# Patient Record
Sex: Male | Born: 1960 | Race: Black or African American | Hispanic: No | Marital: Single | Smoking: Never smoker
Health system: Southern US, Community
[De-identification: ages and names within clinical notes are randomized; demographics above are authoritative.]

## PROBLEM LIST (undated history)

## (undated) DIAGNOSIS — I10 Essential (primary) hypertension: Secondary | ICD-10-CM

---

## 2014-01-18 ENCOUNTER — Emergency Department: Payer: Self-pay | Admitting: Emergency Medicine

## 2014-01-18 LAB — CBC
HCT: 45.6 % (ref 40.0–52.0)
HGB: 15.5 g/dL (ref 13.0–18.0)
MCH: 32 pg (ref 26.0–34.0)
MCHC: 34 g/dL (ref 32.0–36.0)
MCV: 94 fL (ref 80–100)
PLATELETS: 325 10*3/uL (ref 150–440)
RBC: 4.84 10*6/uL (ref 4.40–5.90)
RDW: 13.5 % (ref 11.5–14.5)
WBC: 10.2 10*3/uL (ref 3.8–10.6)

## 2014-01-18 LAB — COMPREHENSIVE METABOLIC PANEL
AST: 36 U/L (ref 15–37)
Albumin: 4.4 g/dL (ref 3.4–5.0)
Alkaline Phosphatase: 85 U/L
Anion Gap: 6 — ABNORMAL LOW (ref 7–16)
BUN: 22 mg/dL — ABNORMAL HIGH (ref 7–18)
Bilirubin,Total: 0.5 mg/dL (ref 0.2–1.0)
CALCIUM: 9.5 mg/dL (ref 8.5–10.1)
CHLORIDE: 106 mmol/L (ref 98–107)
Co2: 26 mmol/L (ref 21–32)
Creatinine: 1.39 mg/dL — ABNORMAL HIGH (ref 0.60–1.30)
EGFR (African American): >60
EGFR (Non-African Amer.): 57 — ABNORMAL LOW
Glucose: 75 mg/dL (ref 65–99)
OSMOLALITY: 278 (ref 275–301)
Potassium: 3.8 mmol/L (ref 3.5–5.1)
SGPT (ALT): 25 U/L
SODIUM: 138 mmol/L (ref 136–145)
Total Protein: 8.7 g/dL — ABNORMAL HIGH (ref 6.4–8.2)

## 2014-02-26 LAB — CBC
HCT: 45.6 % (ref 40.0–52.0)
HGB: 15 g/dL (ref 13.0–18.0)
MCH: 30.8 pg (ref 26.0–34.0)
MCHC: 32.9 g/dL (ref 32.0–36.0)
MCV: 94 fL (ref 80–100)
PLATELETS: 359 10*3/uL (ref 150–440)
RBC: 4.87 10*6/uL (ref 4.40–5.90)
RDW: 13.6 % (ref 11.5–14.5)
WBC: 11.2 10*3/uL — ABNORMAL HIGH (ref 3.8–10.6)

## 2014-02-26 LAB — COMPREHENSIVE METABOLIC PANEL
ALK PHOS: 76 U/L (ref 46–116)
ALT: 28 U/L (ref 14–63)
AST: 53 U/L — AB (ref 15–37)
Albumin: 4.4 g/dL (ref 3.4–5.0)
Anion Gap: 7 (ref 7–16)
BUN: 19 mg/dL — ABNORMAL HIGH (ref 7–18)
Bilirubin,Total: 0.4 mg/dL (ref 0.2–1.0)
CO2: 27 mmol/L (ref 21–32)
Calcium, Total: 9.4 mg/dL (ref 8.5–10.1)
Chloride: 102 mmol/L (ref 98–107)
Creatinine: 1.52 mg/dL — ABNORMAL HIGH (ref 0.60–1.30)
EGFR (African American): >60
EGFR (Non-African Amer.): 51 — ABNORMAL LOW
Glucose: 73 mg/dL (ref 65–99)
OSMOLALITY: 273 (ref 275–301)
POTASSIUM: 3.8 mmol/L (ref 3.5–5.1)
Sodium: 136 mmol/L (ref 136–145)
Total Protein: 8.5 g/dL — ABNORMAL HIGH (ref 6.4–8.2)

## 2014-02-26 LAB — URINALYSIS, COMPLETE
BACTERIA: NONE SEEN
BILIRUBIN, UR: NEGATIVE
GLUCOSE, UR: NEGATIVE mg/dL (ref 0–75)
Ketone: NEGATIVE
Leukocyte Esterase: NEGATIVE
Nitrite: NEGATIVE
Ph: 5 (ref 4.5–8.0)
Protein: NEGATIVE
RBC,UR: NONE SEEN /HPF (ref 0–5)
SPECIFIC GRAVITY: 1.015 (ref 1.003–1.030)
Squamous Epithelial: NONE SEEN

## 2014-02-26 LAB — ACETAMINOPHEN LEVEL

## 2014-02-26 LAB — DRUG SCREEN, URINE
Amphetamines, Ur Screen: NEGATIVE (ref ?–1000)
Barbiturates, Ur Screen: NEGATIVE (ref ?–200)
Benzodiazepine, Ur Scrn: NEGATIVE (ref ?–200)
CANNABINOID 50 NG, UR ~~LOC~~: NEGATIVE (ref ?–50)
Cocaine Metabolite,Ur ~~LOC~~: POSITIVE (ref ?–300)
MDMA (Ecstasy)Ur Screen: NEGATIVE (ref ?–500)
Methadone, Ur Screen: NEGATIVE (ref ?–300)
OPIATE, UR SCREEN: NEGATIVE (ref ?–300)
PHENCYCLIDINE (PCP) UR S: NEGATIVE (ref ?–25)
Tricyclic, Ur Screen: NEGATIVE (ref ?–1000)

## 2014-02-26 LAB — ETHANOL: Ethanol: 106 mg/dL

## 2014-02-26 LAB — SALICYLATE LEVEL: SALICYLATES, SERUM: 4.2 mg/dL — AB

## 2014-02-27 ENCOUNTER — Inpatient Hospital Stay: Payer: Self-pay | Admitting: Psychiatry

## 2014-03-01 LAB — BASIC METABOLIC PANEL
Anion Gap: 7 (ref 7–16)
BUN: 15 mg/dL (ref 7–18)
CO2: 29 mmol/L (ref 21–32)
CREATININE: 1.35 mg/dL — AB (ref 0.60–1.30)
Calcium, Total: 9.7 mg/dL (ref 8.5–10.1)
Chloride: 103 mmol/L (ref 98–107)
GFR CALC NON AF AMER: 59 — AB
GLUCOSE: 102 mg/dL — AB (ref 65–99)
Osmolality: 279 (ref 275–301)
Potassium: 4.1 mmol/L (ref 3.5–5.1)
SODIUM: 139 mmol/L (ref 136–145)

## 2014-03-03 LAB — BASIC METABOLIC PANEL
Anion Gap: 8 (ref 7–16)
BUN: 17 mg/dL (ref 7–18)
CALCIUM: 9.4 mg/dL (ref 8.5–10.1)
CHLORIDE: 100 mmol/L (ref 98–107)
Co2: 29 mmol/L (ref 21–32)
Creatinine: 1.44 mg/dL — ABNORMAL HIGH (ref 0.60–1.30)
EGFR (Non-African Amer.): 55 — ABNORMAL LOW
Glucose: 92 mg/dL (ref 65–99)
Osmolality: 275 (ref 275–301)
Potassium: 4 mmol/L (ref 3.5–5.1)
Sodium: 137 mmol/L (ref 136–145)

## 2014-05-30 NOTE — Consult Note (Signed)
PATIENT NAME:  Bryan Benton, Bryan Benton MR#:  [id number] DATE OF BIRTH:  May 10, 1960  DATE OF CONSULTATION:  02/26/2014  IDENTIFYING INFORMATION AND REASON FOR CONSULTATION: A 54 year old man with a history of cocaine dependence and depression.   CHIEF COMPLAINT: "I relapsed on crack."   HISTORY OF PRESENT ILLNESS: The patient comes into the hospital saying that he relapsed on crack cocaine last night, but also reporting that he is having a return of severe depression hallucinations. He has been feeling very depressed for the last couple of weeks and has been having auditory hallucinations. Voices telling him to do bad things and hurt himself. This has been getting worse for a couple of weeks. He had run out of his psychiatric medicine a few weeks ago. He relapsed onto crack cocaine last night after about 7 months of sobriety. Was not drinking or using any other drugs. Reports having serious thoughts about wanting to die or wanting to kill himself. Currently no homicidal ideation.   PAST PSYCHIATRIC HISTORY: He has been treated with Prozac and Zyprexa in the past. He also, at one time in prison, was on Haldol. He denies that he has ever tried to kill himself in the past, does not have a serious history of violence. He says that the Prozac and Zyprexa were helpful for him.   SUBSTANCE ABUSE HISTORY: Cocaine abuse in the form of crack cocaine for years. He has been able to stay clean for up to a year at a time. He used to go to [facility], where he was not only getting medicine, but going to groups, but stopped going a while ago.   SOCIAL HISTORY: Lives with a fiance. Works at a [employer]. Likes his job and values it. Has an adult child, who lives elsewhere.   FAMILY HISTORY: No known family history of mental health problems.   PAST MEDICAL HISTORY: Has high blood pressure, supposed to be on hydrochlorothiazide and lisinopril.   REVIEW OF SYSTEMS: Sad mood. Auditory hallucinations. Suicidal ideation.  Fatigue. No other specific physical complaints.   MENTAL STATUS EXAMINATION: The patient is awake, alert and interactive. Eye contact is good. Psychomotor activity slow. Speech very quiet, almost whispered, decreased in amount. Affect flat. Mood stated as depressed. Thoughts are slow, but lucid. No obvious delusions. Endorses auditory hallucinations. Denies visual hallucinations and endorses suicidal ideation. No homicidal ideation. Able to repeat 3 words immediately, remembers only 1 out of 3 at 3 minutes. Judgment and insight impaired. Alert and oriented x4. Intelligence normal.   LABORATORY RESULTS: Drug screen positive for cocaine. Chemistry panel: Elevated creatinine 1.52, BUN elevated and 19 elevated AST at 53. Alcohol level 106. CBC: Elevated white count of 11.2, otherwise unremarkable. Urinalysis unremarkable.   VITAL SIGNS: Blood pressure 148/96, respirations 18, pulse 74, temperature 97.8.   ASSESSMENT: A 54 year old man with a history of depression with psychotic features and cocaine abuse. Started having psychosis and depression for the last couple of weeks, only relapsed on cocaine yesterday. Having suicidal ideation. Needs hospitalization.   TREATMENT PLAN: Admit to psychiatry. Suicide, fall, and close precautions in place. Restart Prozac 20 mg per day and Zyprexa 10 mg at night.   DIAGNOSIS, PRINCIPAL AND PRIMARY:  AXIS I: Major depression, recurrent, severe with psychotic features.   SECONDARY DIAGNOSES:  AXIS I: Cocaine abuse, severe.   Alcohol abuse, mild to moderate.   AXIS II: Deferred.   AXIS III: High blood pressure.   ____________________________ Gonzella Lex, MD jtc:ap D: 02/26/2014 16:38:00 ET  T: 02/26/2014 16:52:52 ET JOB#: [id number]  cc: Gonzella Lex, MD, <Dictator> Gonzella Lex MD ELECTRONICALLY SIGNED 03/10/2014 17:25
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
## 2018-02-27 ENCOUNTER — Telehealth: Payer: Self-pay | Admitting: Gastroenterology

## 2018-02-27 NOTE — Telephone Encounter (Signed)
Returned patients call to schedule his colonoscopy. LVM for him to call back, and if I am unable to take his call when he calls back I will do my best to call him by the end of the day or the next business morning.  Thanks [employer]

## 2018-02-27 NOTE — Telephone Encounter (Signed)
Patient called returning Michele's call to schedule a colonoscopy

## 2018-03-10 ENCOUNTER — Encounter: Payer: Self-pay | Admitting: *Deleted

## 2018-03-17 ENCOUNTER — Telehealth: Payer: Self-pay | Admitting: Gastroenterology

## 2018-03-17 ENCOUNTER — Other Ambulatory Visit: Payer: Self-pay

## 2018-03-17 DIAGNOSIS — Z1211 Encounter for screening for malignant neoplasm of colon: Secondary | ICD-10-CM

## 2018-03-17 NOTE — Telephone Encounter (Signed)
Call returned.  Colonoscopy has been scheduled with Dr. Marius Ditch for 04/07/18 at [facility].  Thanks [employer]

## 2018-03-17 NOTE — Telephone Encounter (Signed)
Patient called returning Michelle's call & letter he received.

## 2018-04-02 ENCOUNTER — Other Ambulatory Visit: Payer: Self-pay

## 2018-04-02 MED ORDER — PEG 3350-KCL-NA BICARB-NACL 420 G PO SOLR: 4000.0000 mL | Freq: Once | ORAL | 0 refills | Status: AC

## 2018-04-04 ENCOUNTER — Other Ambulatory Visit: Payer: Self-pay

## 2018-04-04 MED ORDER — PEG 3350-KCL-NA BICARB-NACL 420 G PO SOLR: 4000.0000 mL | Freq: Once | ORAL | 0 refills | Status: AC

## 2018-04-07 ENCOUNTER — Encounter: Payer: Self-pay | Admitting: Student

## 2018-04-07 ENCOUNTER — Encounter
Admission: RE | Disposition: A | Discharge status: Home / Self Care | Payer: Self-pay | Source: Home / Self Care | Attending: Gastroenterology

## 2018-04-07 ENCOUNTER — Other Ambulatory Visit: Payer: Self-pay

## 2018-04-07 ENCOUNTER — Ambulatory Visit: Payer: Commercial Managed Care - PPO | Admitting: Certified Registered"

## 2018-04-07 ENCOUNTER — Ambulatory Visit
Admission: RE | Admit: 2018-04-07 | Discharge: 2018-04-07 | Disposition: A | Discharge status: Home / Self Care | Payer: Commercial Managed Care - PPO | Attending: Gastroenterology | Admitting: Gastroenterology

## 2018-04-07 DIAGNOSIS — D123 Benign neoplasm of transverse colon: Secondary | ICD-10-CM | POA: Diagnosis not present

## 2018-04-07 DIAGNOSIS — Z1211 Encounter for screening for malignant neoplasm of colon: Secondary | ICD-10-CM | POA: Diagnosis present

## 2018-04-07 DIAGNOSIS — I10 Essential (primary) hypertension: Secondary | ICD-10-CM | POA: Insufficient documentation

## 2018-04-07 DIAGNOSIS — K648 Other hemorrhoids: Secondary | ICD-10-CM | POA: Insufficient documentation

## 2018-04-07 DIAGNOSIS — Z79899 Other long term (current) drug therapy: Secondary | ICD-10-CM | POA: Diagnosis not present

## 2018-04-07 HISTORY — DX: Essential (primary) hypertension: I10

## 2018-04-07 HISTORY — PX: COLONOSCOPY WITH PROPOFOL: SHX5780

## 2018-04-07 SURGERY — COLONOSCOPY WITH PROPOFOL
Anesthesia: General

## 2018-04-07 MED ORDER — PROPOFOL 10 MG/ML IV BOLUS
INTRAVENOUS | Status: AC
  Filled 2018-04-07: qty 40

## 2018-04-07 MED ORDER — PROPOFOL 10 MG/ML IV BOLUS
INTRAVENOUS | Status: DC | PRN
  Administered 2018-04-07: 80 mg via INTRAVENOUS

## 2018-04-07 MED ORDER — FENTANYL CITRATE (PF) 100 MCG/2ML IJ SOLN: 25.0000 ug | INTRAMUSCULAR | Status: DC | PRN

## 2018-04-07 MED ORDER — ONDANSETRON HCL 4 MG/2ML IJ SOLN: 4.0000 mg | Freq: Once | INTRAMUSCULAR | Status: DC | PRN

## 2018-04-07 MED ORDER — PROPOFOL 500 MG/50ML IV EMUL
INTRAVENOUS | Status: DC | PRN
  Administered 2018-04-07: 150 ug/kg/min via INTRAVENOUS

## 2018-04-07 MED ORDER — SODIUM CHLORIDE 0.9 % IV SOLN
INTRAVENOUS | Status: DC
  Administered 2018-04-07: 08:00:00 via INTRAVENOUS

## 2018-04-07 MED ORDER — LIDOCAINE HCL (CARDIAC) PF 100 MG/5ML IV SOSY
PREFILLED_SYRINGE | INTRAVENOUS | Status: DC | PRN
  Administered 2018-04-07: 80 mg via INTRAVENOUS

## 2018-04-07 NOTE — Transfer of Care (Signed)
Immediate Anesthesia Transfer of Care Note  Patient: Bryan Benton  Procedure(s) Performed: COLONOSCOPY WITH PROPOFOL (N/A )  Patient Location: PACU  Anesthesia Type:General  Level of Consciousness: sedated  Airway & Oxygen Therapy: Patient Spontanous Breathing  Post-op Assessment: Report given to RN and Post -op Vital signs reviewed and stable  Post vital signs: Reviewed and stable  Last Vitals:  Vitals Value Taken Time  BP    Temp    Pulse    Resp    SpO2      Last Pain:  Vitals:   04/07/18 0801  TempSrc: Tympanic  PainSc: 0-No pain         Complications: No apparent anesthesia complications

## 2018-04-07 NOTE — Anesthesia Post-op Follow-up Note (Signed)
Anesthesia QCDR form completed.        

## 2018-04-07 NOTE — Op Note (Signed)
[facility] Patient Name: Bryan Benton Procedure Date: 04/07/2018 8:28 AM MRN: [id number] Account #: [id number] Date of Birth: 09-20-1960 Admit Type: Outpatient Age: 58 Room: [facility] ENDO ROOM 2 Gender: Male Note Status: Finalized Procedure:            Colonoscopy Indications:          Screening for colorectal malignant neoplasm, This is                        the patient's first colonoscopy Providers:            Lin Landsman MD, MD Referring MD:         Baxter Kail. Rebeca Alert MD, MD (Referring MD) Medicines:            Monitored Anesthesia Care Complications:        No immediate complications. Estimated blood loss: None. Procedure:            Pre-Anesthesia Assessment:                       - Prior to the procedure, a History and Physical was                        performed, and patient medications and allergies were                        reviewed. The patient is competent. The risks and                        benefits of the procedure and the sedation options and                        risks were discussed with the patient. All questions                        were answered and informed consent was obtained.                        Patient identification and proposed procedure were                        verified by the physician, the nurse, the                        anesthesiologist, the anesthetist and the technician in                        the pre-procedure area in the procedure room in the                        endoscopy suite. Mental Status Examination: alert and                        oriented. Airway Examination: normal oropharyngeal                        airway and neck mobility. Respiratory Examination:                        clear to auscultation. CV Examination: normal.  Prophylactic Antibiotics: The patient does not require                        prophylactic antibiotics. Prior Anticoagulants: The             patient has taken no previous anticoagulant or                        antiplatelet agents. ASA Grade Assessment: II - A                        patient with mild systemic disease. After reviewing the                        risks and benefits, the patient was deemed in                        satisfactory condition to undergo the procedure. The                        anesthesia plan was to use monitored anesthesia care                        (MAC). Immediately prior to administration of                        medications, the patient was re-assessed for adequacy                        to receive sedatives. The heart rate, respiratory rate,                        oxygen saturations, blood pressure, adequacy of                        pulmonary ventilation, and response to care were                        monitored throughout the procedure. The physical status                        of the patient was re-assessed after the procedure.                       After obtaining informed consent, the colonoscope was                        passed under direct vision. Throughout the procedure,                        the patient's blood pressure, pulse, and oxygen                        saturations were monitored continuously. The                        Colonoscope was introduced through the anus and                        advanced to the the terminal ileum, with identification  of the appendiceal orifice and IC valve. The                        colonoscopy was performed without difficulty. The                        patient tolerated the procedure well. The quality of                        the bowel preparation was evaluated using the BBPS                        [facility] Bowel Preparation Scale) with scores of: Right                        Colon = 3, Transverse Colon = 3 and Left Colon = 3                        (entire mucosa seen well with no residual staining,                         small fragments of stool or opaque liquid). The total                        BBPS score equals 9. Findings:      The perianal and digital rectal examinations were normal. Pertinent       negatives include normal sphincter tone and no palpable rectal lesions.      The terminal ileum appeared normal.      A diminutive polyp was found in the transverse colon. The polyp was       sessile. The polyp was removed with a cold biopsy forceps. Resection and       retrieval were complete.      Non-bleeding internal hemorrhoids were found during retroflexion.      The exam was otherwise without abnormality. Impression:           - The examined portion of the ileum was normal.                       - One diminutive polyp in the transverse colon, removed                        with a cold biopsy forceps. Resected and retrieved.                       - Non-bleeding internal hemorrhoids.                       - The examination was otherwise normal. Recommendation:       - Discharge patient to home (with escort).                       - Resume previous diet today.                       - Continue present medications.                       - Await pathology results.                       -  Repeat colonoscopy in 7 years for surveillance. Procedure Code(s):    --- Professional ---                       [phone], Colonoscopy, flexible; with biopsy, single or                        multiple Diagnosis Code(s):    --- Professional ---                       Z12.11, Encounter for screening for malignant neoplasm                        of colon                       K64.8, Other hemorrhoids                       D12.3, Benign neoplasm of transverse colon (hepatic                        flexure or splenic flexure) CPT copyright 2018 American Medical Association. All rights reserved. The codes documented in this report are preliminary and upon coder review may  be revised to meet current compliance  requirements. Dr. Ulyess Mort Lin Landsman MD, MD 04/07/2018 9:01:52 AM This report has been signed electronically. Number of Addenda: 0 Note Initiated On: 04/07/2018 8:28 AM Scope Withdrawal Time: 0 hours 11 minutes 26 seconds  Total Procedure Duration: 0 hours 14 minutes 26 seconds       [facility]

## 2018-04-07 NOTE — H&P (Signed)
Cephas Darby, MD [address]  [place], [place] [address]  Main: [phone]  Fax: [phone] Pager: [phone]  Primary Care Physician:  Letta Median, MD Primary Gastroenterologist:  Dr. Cephas Darby  Pre-Procedure History & Physical: HPI:  Bryan Benton is a 58 y.o. male is here for an colonoscopy.   Past Medical History:  Diagnosis Date  . Hypertension     History reviewed. No pertinent surgical history.  Prior to Admission medications   Medication Sig Start Date End Date Taking? Authorizing Provider  hydrochlorothiazide (HYDRODIURIL) 25 MG tablet Take 25 mg by mouth daily.   Yes [provider]    Allergies as of 03/17/2018  . (Not on File)    History reviewed. No pertinent family history.  Social History   Socioeconomic History  . Marital status: Single    Spouse name: Not on file  . Number of children: Not on file  . Years of education: Not on file  . Highest education level: Not on file  Occupational History  . Not on file  Social Needs  . Financial resource strain: Not on file  . Food insecurity:    Worry: Not on file    Inability: Not on file  . Transportation needs:    Medical: Not on file    Non-medical: Not on file  Tobacco Use  . Smoking status: Never Smoker  . Smokeless tobacco: Never Used  Substance and Sexual Activity  . Alcohol use: Not Currently    Comment: four years ago  . Drug use: Yes    Types: Marijuana, "Crack" cocaine    Comment: four years ago  . Sexual activity: Not on file  Lifestyle  . Physical activity:    Days per week: Not on file    Minutes per session: Not on file  . Stress: Not on file  Relationships  . Social connections:    Talks on phone: Not on file    Gets together: Not on file    Attends religious service: Not on file    Active member of club or organization: Not on file    Attends meetings of clubs or organizations: Not on file    Relationship status: Not on  file  . Intimate partner violence:    Fear of current or ex partner: Not on file    Emotionally abused: Not on file    Physically abused: Not on file    Forced sexual activity: Not on file  Other Topics Concern  . Not on file  Social History Narrative  . Not on file    Review of Systems: See HPI, otherwise negative ROS  Physical Exam: BP (!) 148/96   Pulse 78   Temp (!) 96.6 F (35.9 C) (Tympanic)   Resp 18   Ht 6\' 2"  (1.88 m)   Wt 97.1 kg   SpO2 100%   BMI 27.48 kg/m  General:   Alert,  pleasant and cooperative in NAD Head:  Normocephalic and atraumatic. Neck:  Supple; no masses or thyromegaly. Lungs:  Clear throughout to auscultation.    Heart:  Regular rate and rhythm. Abdomen:  Soft, nontender and nondistended. Normal bowel sounds, without guarding, and without rebound.   Neurologic:  Alert and  oriented x4;  grossly normal neurologically.  Impression/Plan: Bryan Benton is here for an colonoscopy to be performed for colon cancer screening  Risks, benefits, limitations, and alternatives regarding  colonoscopy have been reviewed with the patient.  Questions have been answered.  All parties agreeable.   Sherri Sear, MD  04/07/2018, 8:38 AM

## 2018-04-07 NOTE — Anesthesia Postprocedure Evaluation (Signed)
Anesthesia Post Note  Patient: Bryan Benton  Procedure(s) Performed: COLONOSCOPY WITH PROPOFOL (N/A )  Anesthesia Type: General Level of consciousness: awake and alert and oriented Pain management: pain level controlled Vital Signs Assessment: post-procedure vital signs reviewed and stable Respiratory status: spontaneous breathing Cardiovascular status: blood pressure returned to baseline Anesthetic complications: no     Last Vitals:  Vitals:   04/07/18 0923 04/07/18 0933  BP: (!) 122/92 (!) 126/96  Pulse: 75 70  Resp: 14 20  Temp:    SpO2: 100% 100%    Last Pain:  Vitals:   04/07/18 0933  TempSrc:   PainSc: 0-No pain                 Hondo Nanda

## 2018-04-07 NOTE — Anesthesia Preprocedure Evaluation (Signed)
Anesthesia Evaluation  Patient identified by MRN, date of birth, ID band Patient awake    Reviewed: Allergy & Precautions, NPO status , Patient's Chart, lab work & pertinent test results  Airway Mallampati: II  TM Distance: >3 FB     Dental   Pulmonary neg pulmonary ROS,    Pulmonary exam normal        Cardiovascular hypertension, Normal cardiovascular exam     Neuro/Psych negative neurological ROS  negative psych ROS   GI/Hepatic negative GI ROS, Neg liver ROS,   Endo/Other  negative endocrine ROS  Renal/GU negative Renal ROS  negative genitourinary   Musculoskeletal negative musculoskeletal ROS (+)   Abdominal Normal abdominal exam  (+)   Peds negative pediatric ROS (+)  Hematology negative hematology ROS (+)   Anesthesia Other Findings   Reproductive/Obstetrics                             Anesthesia Physical Anesthesia Plan  ASA: II  Anesthesia Plan: General   Post-op Pain Management:    Induction: Intravenous  PONV Risk Score and Plan: Propofol infusion  Airway Management Planned: Nasal Cannula  Additional Equipment:   Intra-op Plan:   Post-operative Plan:   Informed Consent: I have reviewed the patients History and Physical, chart, labs and discussed the procedure including the risks, benefits and alternatives for the proposed anesthesia with the patient or authorized representative who has indicated his/her understanding and acceptance.     Dental advisory given  Plan Discussed with: CRNA and Surgeon  Anesthesia Plan Comments:         Anesthesia Quick Evaluation

## 2018-04-08 ENCOUNTER — Encounter: Payer: Self-pay | Admitting: Gastroenterology

## 2018-04-08 LAB — SURGICAL PATHOLOGY

## 2018-04-09 ENCOUNTER — Encounter: Payer: Self-pay | Admitting: Gastroenterology

## 2018-06-03 ENCOUNTER — Encounter: Payer: Self-pay | Admitting: Gastroenterology

## 2020-01-27 ENCOUNTER — Emergency Department
Admission: EM | Admit: 2020-01-27 | Discharge: 2020-01-27 | Disposition: A | Discharge status: Home / Self Care | Payer: Commercial Managed Care - PPO | Attending: Emergency Medicine | Admitting: Emergency Medicine

## 2020-01-27 ENCOUNTER — Emergency Department: Payer: Commercial Managed Care - PPO

## 2020-01-27 ENCOUNTER — Other Ambulatory Visit: Payer: Self-pay

## 2020-01-27 DIAGNOSIS — I10 Essential (primary) hypertension: Secondary | ICD-10-CM | POA: Diagnosis not present

## 2020-01-27 DIAGNOSIS — U071 COVID-19: Secondary | ICD-10-CM

## 2020-01-27 DIAGNOSIS — Z79899 Other long term (current) drug therapy: Secondary | ICD-10-CM | POA: Insufficient documentation

## 2020-01-27 DIAGNOSIS — R059 Cough, unspecified: Secondary | ICD-10-CM | POA: Diagnosis present

## 2020-01-27 LAB — POC SARS CORONAVIRUS 2 AG -  ED: SARS Coronavirus 2 Ag: POSITIVE — AB

## 2020-01-27 MED ORDER — AZITHROMYCIN 250 MG PO TABS: ORAL_TABLET | ORAL | 0 refills | Status: AC

## 2020-01-27 MED ORDER — METHYLPREDNISOLONE 4 MG PO TBPK: ORAL_TABLET | ORAL | 0 refills | Status: AC

## 2020-01-27 NOTE — ED Notes (Signed)
See triage note. Pt ambulatory to room. Pt in NAD. Pt c/o cough, body aches, fever, chills and sore throat x2 days.

## 2020-01-27 NOTE — ED Triage Notes (Signed)
Pt c/o cough with body aches and intermittent fever, chills and sore throat for the past 2 days.,

## 2020-01-27 NOTE — ED Provider Notes (Signed)
[facility] Emergency Department Provider Note  ____________________________________________   Event Date/Time   First MD Initiated Contact with Patient 01/27/20 1358     (approximate)  I have reviewed the triage vital signs and the nursing notes.   HISTORY  Chief Complaint URI    HPI Bryan Benton is a 59 y.o. male presents emergency department Covid-like symptoms. Patient states he has had a cough, body aches, fever, chills and sore throat for 2 days. Patient had Covid last year. He did not get vaccinated after having Covid. He is not vaccinated for the flu.    Past Medical History:  Diagnosis Date  . Hypertension     Patient Active Problem List   Diagnosis Date Noted  . Encounter for screening colonoscopy     Past Surgical History:  Procedure Laterality Date  . COLONOSCOPY WITH PROPOFOL N/A 04/07/2018   Procedure: COLONOSCOPY WITH PROPOFOL;  Surgeon: Toney Reil, MD;  Location: [facility] ENDOSCOPY;  Service: Gastroenterology;  Laterality: N/A;    Prior to Admission medications   Medication Sig Start Date End Date Taking? Authorizing Provider  azithromycin (ZITHROMAX Z-PAK) 250 MG tablet 2 pills today then 1 pill a day for 4 days 01/27/20  Yes Anacaren Kohan, Roselyn Bering, PA-C  methylPREDNISolone (MEDROL DOSEPAK) 4 MG TBPK tablet Take 6 pills on day one then decrease by 1 pill each day 01/27/20  Yes Sol Odor, Roselyn Bering, PA-C  hydrochlorothiazide (HYDRODIURIL) 25 MG tablet Take 25 mg by mouth daily.    [provider]    Allergies Patient has no known allergies.  No family history on file.  Social History Social History   Tobacco Use  . Smoking status: Never Smoker  . Smokeless tobacco: Never Used  Vaping Use  . Vaping Use: Never used  Substance Use Topics  . Alcohol use: Not Currently    Comment: four years ago  . Drug use: Yes    Types: Marijuana, "Crack" cocaine    Comment: four years ago    Review of  Systems  Constitutional: Pulse fever/chills Eyes: No visual changes. ENT: Positive sore throat. Respiratory: Positive cough Cardiovascular: Denies chest pain Gastrointestinal: Denies abdominal pain Genitourinary: Negative for dysuria. Musculoskeletal: Negative for back pain. Skin: Negative for rash. Psychiatric: no mood changes,     ____________________________________________   PHYSICAL EXAM:  VITAL SIGNS: ED Triage Vitals [01/27/20 1305]  Enc Vitals Group     BP (!) 134/103     Pulse Rate 86     Resp 17     Temp 98.5 F (36.9 C)     Temp Source Oral     SpO2 100 %     Weight 210 lb (95.3 kg)     Height 6\' 2"  (1.88 m)     Head Circumference      Peak Flow      Pain Score 0     Pain Loc      Pain Edu?      Excl. in GC?     Constitutional: Alert and oriented. Well appearing and in no acute distress. Eyes: Conjunctivae are normal.  Head: Atraumatic. Nose: No congestion/rhinnorhea. Mouth/Throat: Mucous membranes are moist.   Neck:  supple no lymphadenopathy noted Cardiovascular: Normal rate, regular rhythm. Heart sounds are normal Respiratory: Normal respiratory effort.  No retractions, lungs c t a  GU: deferred Musculoskeletal: FROM all extremities, warm and well perfused Neurologic:  Normal speech and language.  Skin:  Skin is warm, dry and intact. No  rash noted. Psychiatric: Mood and affect are normal. Speech and behavior are normal.  ____________________________________________   LABS (all labs ordered are listed, but only abnormal results are displayed)  Labs Reviewed  POC SARS CORONAVIRUS 2 AG -  ED - Abnormal; Notable for the following components:      Result Value   SARS Coronavirus 2 Ag Positive (*)    All other components within normal limits   ____________________________________________   ____________________________________________  RADIOLOGY  Chest x-ray  ____________________________________________   PROCEDURES  Procedure(s)  performed: No  Procedures    ____________________________________________   INITIAL IMPRESSION / ASSESSMENT AND PLAN / ED COURSE  Pertinent labs & imaging results that were available during my care of the patient were reviewed by me and considered in my medical decision making (see chart for details).   Patient is a 59 year old male presents emergency department Covid-like symptoms. See HPI. Patient is unvaccinated.  Physical exam is consistent with Covid  POC Covid test ordered, chest x-ray  Chest x-ray appears to be normal, this was reviewed by me and confirmed by radiology  POC Covid test positive.  I did explain the findings to the patient. He is to quarantine for 10 days. Return emergency department worsening. Follow-up [facility] doctor as needed. Take over-the-counter measures for his symptoms. He was also given a Medrol Dosepak and Z-Pak. He was discharged in stable condition.     Bryan Benton was evaluated in Emergency Department on 01/27/2020 for the symptoms described in the history of present illness. He was evaluated in the context of the global COVID-19 pandemic, which necessitated consideration that the patient might be at risk for infection with the SARS-CoV-2 virus that causes COVID-19. Institutional protocols and algorithms that pertain to the evaluation of patients at risk for COVID-19 are in a state of rapid change based on information released by regulatory bodies including the CDC and federal and state organizations. These policies and algorithms were followed during the patient's care in the ED.    As part of my medical decision making, I reviewed the following data within the electronic MEDICAL RECORD NUMBER Nursing notes reviewed and incorporated, Labs reviewed , Old chart reviewed, Radiograph reviewed , Notes from prior ED visits and [place] Controlled Substance Database  ____________________________________________   FINAL CLINICAL IMPRESSION(S) / ED  DIAGNOSES  Final diagnoses:  COVID-19      NEW MEDICATIONS STARTED DURING THIS VISIT:  New Prescriptions   AZITHROMYCIN (ZITHROMAX Z-PAK) 250 MG TABLET    2 pills today then 1 pill a day for 4 days   METHYLPREDNISOLONE (MEDROL DOSEPAK) 4 MG TBPK TABLET    Take 6 pills on day one then decrease by 1 pill each day     Note:  This document was prepared using Dragon voice recognition software and may include unintentional dictation errors.    Faythe Ghee, PA-C 01/27/20 1458    Minna Antis, MD 01/28/20 1001

## 2020-01-27 NOTE — Discharge Instructions (Signed)
Return emergency department worsening, follow-up with your regular doctor if not improving in 1 week. Take medication as prescribed Take over-the-counter vitamin C, vitamin D, and zinc to help with your symptoms Take over-the-counter Mucinex to prevent mucous plugs in your chest, this also help with your cough Quarantine for 10 days

## 2021-04-03 ENCOUNTER — Ambulatory Visit: Payer: Self-pay | Admitting: Family Medicine

## 2021-04-03 ENCOUNTER — Other Ambulatory Visit: Payer: Self-pay

## 2021-04-03 ENCOUNTER — Encounter: Payer: Self-pay | Admitting: Family Medicine

## 2021-04-03 DIAGNOSIS — A549 Gonococcal infection, unspecified: Secondary | ICD-10-CM

## 2021-04-03 DIAGNOSIS — Z113 Encounter for screening for infections with a predominantly sexual mode of transmission: Secondary | ICD-10-CM

## 2021-04-03 LAB — HM HIV SCREENING LAB: HM HIV Screening: NEGATIVE

## 2021-04-03 LAB — HM HEPATITIS C SCREENING LAB: HM Hepatitis Screen: NEGATIVE

## 2021-04-03 LAB — GRAM STAIN

## 2021-04-03 LAB — HEPATITIS B SURFACE ANTIGEN: Hepatitis B Surface Ag: NONREACTIVE

## 2021-04-03 MED ORDER — CEFTRIAXONE SODIUM 500 MG IJ SOLR
500.0000 mg | Freq: Once | INTRAMUSCULAR | Status: AC
  Administered 2021-04-03: 500 mg via INTRAMUSCULAR

## 2021-04-03 MED ORDER — DOXYCYCLINE HYCLATE 100 MG PO TABS: 100.0000 mg | ORAL_TABLET | Freq: Two times a day (BID) | ORAL | 0 refills | Status: AC

## 2021-04-03 NOTE — Progress Notes (Signed)
Pt here for STD screening.  Gram stain results reviewed and medication dispensed per SO.  Ceftriaxone 500 mg given IM without any complications.  Condoms given.  Windle Guard, RN ? ? ?

## 2021-04-03 NOTE — Progress Notes (Signed)
[facility] Department ?STI clinic/screening visit ? ?Subjective:  ?Bryan Benton is a 61 y.o. male being seen today for an STI screening visit. The patient reports they do have symptoms.   ? ?Patient has the following medical conditions:   ?Patient Active Problem List  ? Diagnosis Date Noted  ? Encounter for screening colonoscopy   ? ? ? ?Chief Complaint  ?Patient presents with  ? SEXUALLY TRANSMITTED DISEASE  ?  screening  ? ? ?HPI ? ?Patient reports here for screening, has s/sx  ? ?Does the patient or their partner desires a pregnancy in the next year? No ? ?Screening for MPX risk: ?Does the patient have an unexplained rash? No ?Is the patient MSM? No ?Does the patient endorse multiple sex partners or anonymous sex partners? Yes ?Did the patient have close or sexual contact with a person diagnosed with MPX? No ?Has the patient traveled outside the [place] where MPX is endemic? No ?Is there a high clinical suspicion for MPX-- evidenced by one of the following No ? -Unlikely to be chickenpox ? -Lymphadenopathy ? -Rash that present in same phase of evolution on any given body part ? ? ?See flowsheet for further details and programmatic requirements.  ? ? ?The following portions of the patient's history were reviewed and updated as appropriate: allergies, current medications, past medical history, past social history, past surgical history and problem list. ? ?Objective:  ?There were no vitals filed for this visit. ? ?Physical Exam ?Constitutional:   ?   Appearance: Normal appearance.  ?HENT:  ?   Head: Normocephalic.  ?   Mouth/Throat:  ?   Mouth: Mucous membranes are moist.  ?   Pharynx: Oropharynx is clear. No oropharyngeal exudate.  ?Pulmonary:  ?   Effort: Pulmonary effort is normal.  ?Genitourinary: ?   Penis: Normal.   ?   Testes: Normal.  ?   Comments: No lice, nits, or pest, no lesions.  odor and green discharge.  Denies pain or tenderness with paplation of testicles.  No lesions, ulcers or masses  present.   ? ?Musculoskeletal:  ?   Cervical back: Normal range of motion.  ?Lymphadenopathy:  ?   Cervical: No cervical adenopathy.  ?Skin: ?   General: Skin is warm and dry.  ?   Findings: No bruising, erythema, lesion or rash.  ?Neurological:  ?   Mental Status: He is alert and oriented to person, place, and time.  ?Psychiatric:     ?   Mood and Affect: Mood normal.     ?   Behavior: Behavior normal.  ? ? ? ? ?Assessment and Plan:  ?Bryan Benton is a 61 y.o. male presenting to the [facility] for STI screening ? ?1. Screening examination for venereal disease ?Patient does have STI symptoms ?Patient accepted all screenings including  gram stain,  oral, urethral, GC and bloodwork for HIV/RPR.  ?Patient meets criteria for HepB screening? Yes. Ordered? Yes ?Patient meets criteria for HepC screening? Yes. Ordered? Yes ?Recommended condom use with all sex ?Discussed importance of condom use for STI prevent ? ?Treat gram stain per standing order ?Discussed time line for State Lab results and that patient will be called with positive results and encouraged patient to call if he had not heard in 2 weeks ?Recommended returning for continued or worsening symptoms.  ? ?- Gonococcus culture ?- Gram stain ?- HBV Antigen/Antibody State Lab ?- HIV/HCV [place] [facility] ?- Syphilis Serology, [place] [facility] ?-  Gonococcus culture ? ?2. Gonorrhea ?Treated for gonorrhea and unknown chlamydia  ?- cefTRIAXone (ROCEPHIN) injection 500 mg ?- doxycycline (VIBRA-TABS) 100 MG tablet; Take 1 tablet (100 mg total) by mouth 2 (two) times daily for 7 days.  Dispense: 14 tablet; Refill: 0 ? ? ? ?No follow-ups on file. ? ?No future appointments. ? ?Junious Dresser, FNP ?

## 2021-04-17 NOTE — Addendum Note (Signed)
Addended by: Junious Dresser on: 04/17/2021 09:33 AM ? ? Modules accepted: Orders ? ?

## 2021-05-09 ENCOUNTER — Telehealth: Payer: Self-pay | Admitting: Family Medicine

## 2021-05-09 NOTE — Telephone Encounter (Signed)
Pt wants someone from the clinic to call him in order to discuss his STD results. Thanks. ?

## 2022-02-12 IMAGING — DX DG CHEST 1V PORT
1 series · 1 of 1 positions shown · non-contrast
Comparison: 12/25/2010, report from 01/07/2020, images not
available.

CLINICAL DATA: Cough and body aches. Fever and chills. Suspected
COVID.

EXAM:
PORTABLE CHEST 1 VIEW

[chest ap]
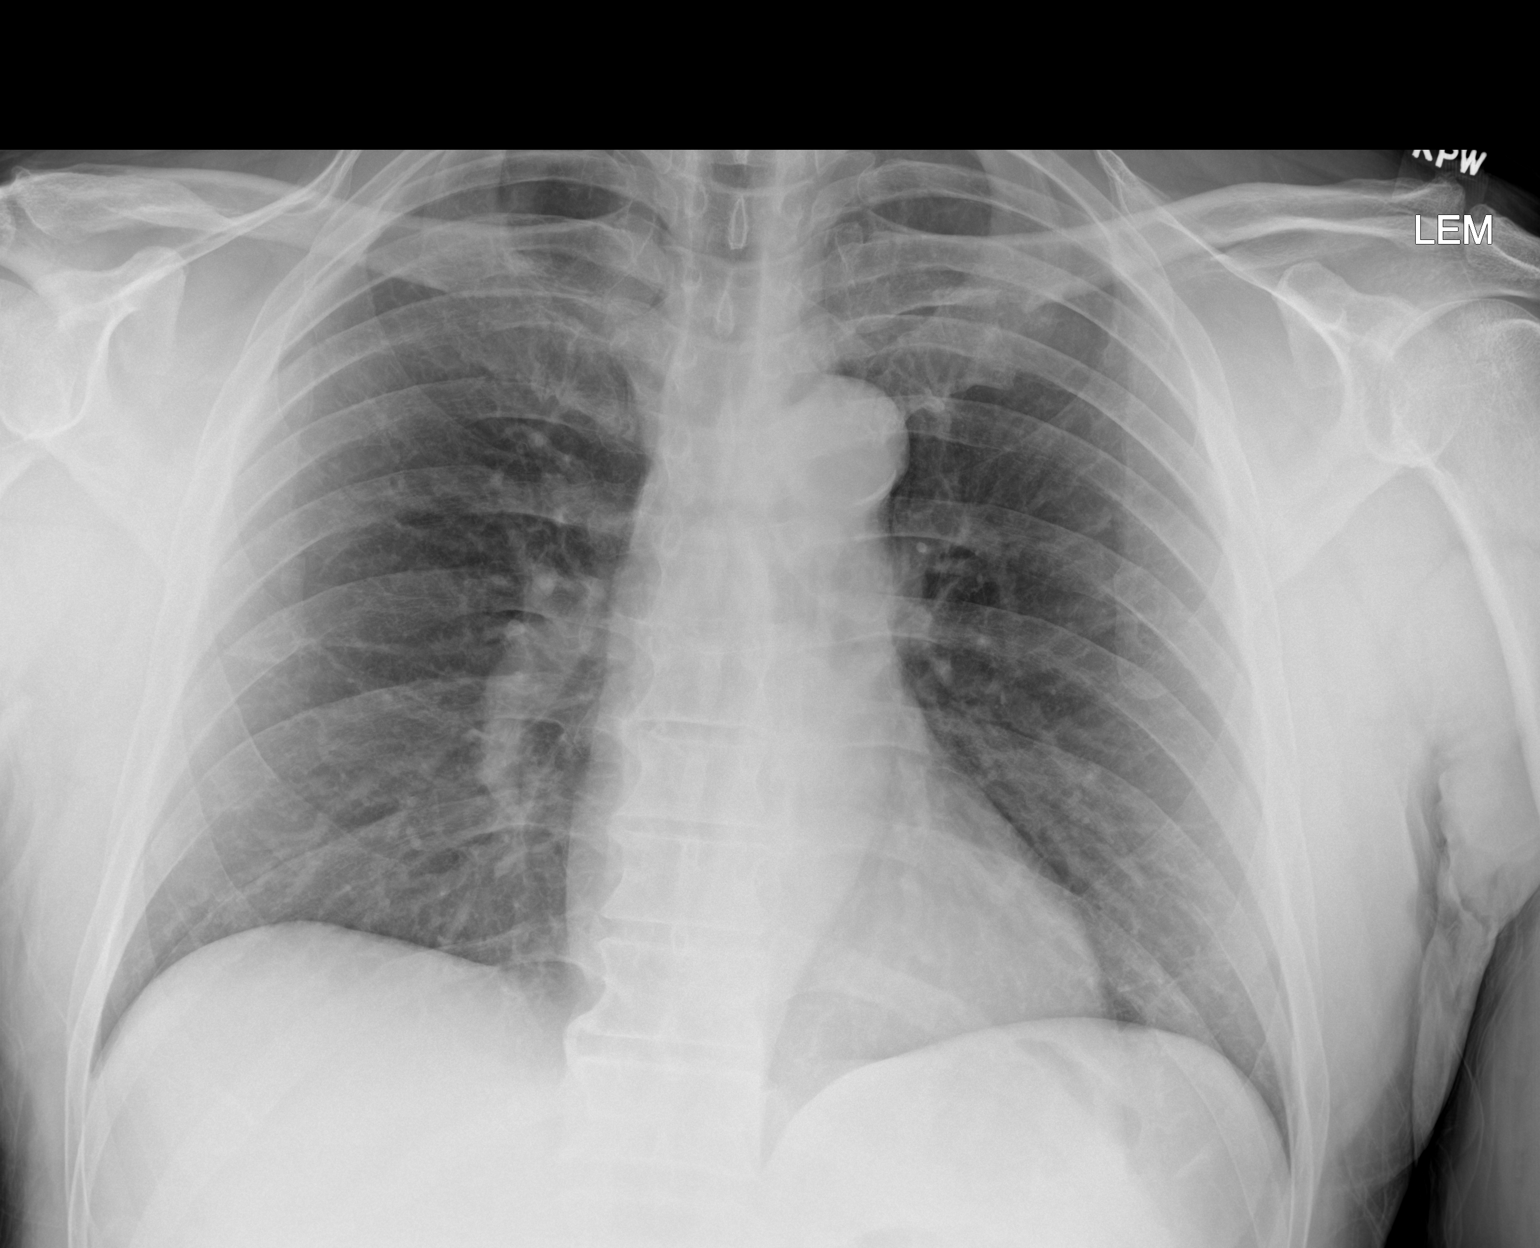

[1 of 1 positions shown; findings below may reference images not displayed]

FINDINGS: Vague subsegmental opacities in the mid lower lung zones. The heart
is normal in size with normal mediastinal contours. Mild aortic
atherosclerosis. No pneumothorax, pleural effusion, or pulmonary
edema. No acute osseous abnormalities are seen.
IMPRESSION: Vague subsegmental opacities in the mid and lower lung zones,
atelectasis versus atypical pneumonia.

## 2024-03-03 ENCOUNTER — Other Ambulatory Visit: Payer: Self-pay

## 2024-03-03 ENCOUNTER — Emergency Department
Admission: EM | Admit: 2024-03-03 | Discharge: 2024-03-03 | Disposition: A | Discharge status: Home / Self Care | Payer: MEDICAID | Attending: Emergency Medicine | Admitting: Emergency Medicine

## 2024-03-03 DIAGNOSIS — Z139 Encounter for screening, unspecified: Secondary | ICD-10-CM

## 2024-03-03 DIAGNOSIS — I10 Essential (primary) hypertension: Secondary | ICD-10-CM | POA: Insufficient documentation

## 2024-03-03 DIAGNOSIS — Z0000 Encounter for general adult medical examination without abnormal findings: Secondary | ICD-10-CM | POA: Insufficient documentation

## 2024-03-03 NOTE — ED Notes (Signed)
 Pt still refusing vitals. Pt discharged to jail with [facility] at this time. Pt denies any needs at this time.

## 2024-03-03 NOTE — ED Provider Notes (Signed)
 "  [facility] Provider Note    Event Date/Time   First MD Initiated Contact with Patient 03/03/24 1505     (approximate)  History   Chief Complaint: Ingestion  HPI  NEYTHAN KOZLOV is a 64 y.o. male with a past medical history of hypertension who presents to the emergency department in police custody for evaluation.  According to report and police patient was witnessed by police taking a bag with a white powder and ingesting a bag.  Please estimated to be between a quarter and 1/2 ounces of powder, however it is unclear what type of powder.  Patient adamantly denies swallowing anything.  Patient is refusing all medical care including physical exam, lab work, imaging, etc.  Patient is awake alert no distress.  Communicates clearly.  Physical Exam   Triage Vital Signs: ED Triage Vitals [03/03/24 1428]  Encounter Vitals Group     BP      Girls Systolic BP Percentile      Girls Diastolic BP Percentile      Boys Systolic BP Percentile      Boys Diastolic BP Percentile      Pulse      Resp      Temp      Temp src      SpO2      Weight      Height      Head Circumference      Peak Flow      Pain Score 0     Pain Loc      Pain Education      Exclude from Growth Chart     Most recent vital signs: There were no vitals filed for this visit.  General: Awake, no distress.  Refusing physical exam. CV:  Good signs of peripheral perfusion. Resp:  Normal effort.  ED Results / Procedures / Treatments   MEDICATIONS ORDERED IN ED: Medications - No data to display   IMPRESSION / MDM / ASSESSMENT AND PLAN / ED COURSE  I reviewed the triage vital signs and the nursing notes.  Patient's presentation is most consistent with acute presentation with potential threat to life or bodily function.  Patient presents to the emergency department in police custody.  Per patient and police patient is under arrest found to have drugs on his person, potentially swallowed  a bag full of white powder per police report.  I had a discussion with the patient saying that we are here for his safety.  In speaking hypothetically if he had swallowed a bag full of white powder such as fentanyl , heroin or cocaine that this very well could be a life-threatening ingestion if it is not cared for.  Patient states he understands this but assures me that he did not swallow any substances.  I discussed with the patient that if he had swallow with the medication then we discharged him to the jail it could be dangerous as he would not be around medical care if the bag were to deteriorate and the powder were to enter his system.  Again discussed the possibility of life-threatening ingestions, but patient again reassures me that he did not take any substances.  He states he has no issues going to jail.  He continues to refuse any medical evaluation but at least to myself he is calm.  Appears to have capacity to make his own medical decisions.  We have discussed with ER administration they have discussed with the law team.  They have given us  a hospital policy worsens we are unable to perform procedures, exam, etc. without the patient's consent unless there was a warrant issued.  After some time please officer states that they will be taken the patient to jail if we are comfortable with it.  Again patient seems to have capacity states he does not want to be observed he does not want to be examined he does not want any medical procedures or intervention.  I discussed the potential fatal effects if he had swallowed a large amount of substance and he is not in the hospital setting.  Patient again assures me that he did not swallow any illicit substances and he has no issues going to jail.  Police state they have a nurse at the jail who can monitor the patient.  We will discharge patient from the emergency department to be closely monitored while in police custody.  At this time as there is no warrant, patient  is refusing and appears to have capacity to make his own decisions does not appear intoxicated etc. we will discharge the patient into police custody to be closely monitored at the jail.  FINAL CLINICAL IMPRESSION(S) / ED DIAGNOSES   Medical evaluation    Note:  This document was prepared using Dragon voice recognition software and may include unintentional dictation errors.   Dorothyann Drivers, MD 03/03/24 1545  "

## 2024-03-03 NOTE — ED Notes (Signed)
 Pt remains in hallway yelling and cursing at staff and officer. Pt states  I refuse, my body, my choice, there is going to be a war if anyone touches me. I am going to jail anyway's. I know if I am sick or not, ya'll aint doing nothing. RN attempted multiple attempts at educating and calming pt down. Pt remains uncooperative and yelling at officer.

## 2024-03-03 NOTE — ED Notes (Signed)
 Pt taken back to ED 19, pt began yelling at this 'RN and other staff. Charge RN at bedside and aware of situation. RN Leonor aware and informed of no vitals yet. Pt refusing to let anyone touch him.
